# Patient Record
Sex: Female | Born: 1955 | Race: White | Hispanic: No | Marital: Married
Health system: Southern US, Community
[De-identification: ages and names within clinical notes are randomized; demographics above are authoritative.]

---

## 2000-05-22 ENCOUNTER — Emergency Department (HOSPITAL_COMMUNITY): Admission: EM | Admit: 2000-05-22 | Discharge: 2000-05-22 | Payer: Self-pay

## 2001-03-18 ENCOUNTER — Encounter: Payer: Self-pay | Admitting: Family Medicine

## 2001-03-18 ENCOUNTER — Encounter: Admission: RE | Admit: 2001-03-18 | Discharge: 2001-03-18 | Payer: Self-pay | Admitting: Family Medicine

## 2002-04-19 ENCOUNTER — Other Ambulatory Visit: Admission: RE | Admit: 2002-04-19 | Discharge: 2002-04-19 | Payer: Self-pay | Admitting: Family Medicine

## 2004-04-04 ENCOUNTER — Other Ambulatory Visit: Admission: RE | Admit: 2004-04-04 | Discharge: 2004-04-04 | Payer: Self-pay | Admitting: Family Medicine

## 2005-04-14 ENCOUNTER — Other Ambulatory Visit: Admission: RE | Admit: 2005-04-14 | Discharge: 2005-04-14 | Payer: Self-pay | Admitting: Physician Assistant

## 2006-04-02 ENCOUNTER — Ambulatory Visit (HOSPITAL_COMMUNITY): Admission: RE | Admit: 2006-04-02 | Discharge: 2006-04-02 | Payer: Self-pay | Admitting: Family Medicine

## 2006-06-25 ENCOUNTER — Other Ambulatory Visit: Admission: RE | Admit: 2006-06-25 | Discharge: 2006-06-25 | Payer: Self-pay | Admitting: Family Medicine

## 2007-02-16 ENCOUNTER — Inpatient Hospital Stay (HOSPITAL_COMMUNITY): Admission: EM | Admit: 2007-02-16 | Discharge: 2007-02-16 | Payer: Self-pay | Admitting: Emergency Medicine

## 2007-06-07 ENCOUNTER — Ambulatory Visit: Payer: Self-pay | Admitting: Internal Medicine

## 2007-07-14 ENCOUNTER — Other Ambulatory Visit: Admission: RE | Admit: 2007-07-14 | Discharge: 2007-07-14 | Payer: Self-pay | Admitting: Family Medicine

## 2007-09-06 ENCOUNTER — Ambulatory Visit (HOSPITAL_BASED_OUTPATIENT_CLINIC_OR_DEPARTMENT_OTHER): Admission: RE | Admit: 2007-09-06 | Discharge: 2007-09-06 | Payer: Self-pay | Admitting: Surgery

## 2007-09-06 ENCOUNTER — Encounter (INDEPENDENT_AMBULATORY_CARE_PROVIDER_SITE_OTHER): Payer: Self-pay | Admitting: Surgery

## 2008-07-17 ENCOUNTER — Other Ambulatory Visit: Admission: RE | Admit: 2008-07-17 | Discharge: 2008-07-17 | Payer: Self-pay | Admitting: Family Medicine

## 2008-07-26 ENCOUNTER — Ambulatory Visit (HOSPITAL_COMMUNITY): Admission: RE | Admit: 2008-07-26 | Discharge: 2008-07-26 | Payer: Self-pay | Admitting: *Deleted

## 2008-12-04 ENCOUNTER — Other Ambulatory Visit: Admission: RE | Admit: 2008-12-04 | Discharge: 2008-12-04 | Payer: Self-pay | Admitting: Family Medicine

## 2009-07-19 ENCOUNTER — Other Ambulatory Visit: Admission: RE | Admit: 2009-07-19 | Discharge: 2009-07-19 | Payer: Self-pay | Admitting: Family Medicine

## 2010-05-29 ENCOUNTER — Other Ambulatory Visit: Admission: RE | Admit: 2010-05-29 | Discharge: 2010-05-29 | Payer: Self-pay | Admitting: Family Medicine

## 2010-09-18 ENCOUNTER — Ambulatory Visit (HOSPITAL_COMMUNITY): Admission: RE | Admit: 2010-09-18 | Discharge: 2010-09-18 | Payer: Self-pay | Admitting: Family Medicine

## 2011-05-13 NOTE — Op Note (Signed)
NAME:  Teresa Rosario, Teresa Rosario                ACCOUNT NO.:  [id number]   MEDICAL RECORD NO.:  [id number]          PATIENT TYPE:  AMB   LOCATION:  DSC                          FACILITY:  [facility]   PHYSICIAN:  Ardeth Sportsman, MD     DATE OF BIRTH:  10/19/1956   DATE OF PROCEDURE:  DATE OF DISCHARGE:                               OPERATIVE REPORT   PREOPERATIVE DIAGNOSIS:  Prolapsing and bleeding stage IV hemorrhoids.   POSTOPERATIVE DIAGNOSES:  1. Prolapsing and bleeding stage IV hemorrhoids.  2. Mild anal stenosis.   PROCEDURE PERFORMED:  1. Examination under anesthesia.  2. External and internal hemorrhoidectomy times 3.   ANESTHESIA:  1. General anesthesia.  2. Anorectal region block with 0.25% bupivacaine with epinephrine      mixed in with [place].   SPECIMENS:  Hemorrhoidal tissue.   DRAINS:  None.   ESTIMATED BLOOD LOSS:  30 mL.   COMPLICATIONS:  No major complications.   INDICATIONS:  Teresa Rosario is a 55 year old female who has been  struggling with hemorrhoids for most of her life given she has irregular  bowel problems.  She is on a bowel regimen.  It seems to have calmed  down, however, she has difficulty pain and discomfort and hard to have  good hygiene in the region.  Teresa Rosario has maximized medical therapy without  any improvement in symptoms.   Anatomy and physiology of hemorrhoidal tissue in the anorectal track was  discussed in detail.  Pathophysiology of hemorrhoidal bleeding and pain  was explained.  Natural history was discussed.  Options were discussed  and recommendations made for an examination under anesthesia with  probably hemorrhoidectomy.  If she had significant muscular anal  hypertensive sphincter she might require sphincterotomy as well.  Risks  such as stroke, heart attack, deep venous thrombosis, pulmonary embolism  and death discussed.  Risk such as bleeding, need for transfusion,  bruising, wound infection, abscess, prolonged pain urinary and rectal  retention, anal stenosis, need for reoperation and other risks were  discussed.  Questions answered and she agreed to proceed.   OPERATIVE FINDINGS:  She had 3 pile chronically prolapsing internal  hemorrhoids, left lateral worse than right, posterior worse than right  anterior, but all significant with chronic epithelization of internal  hemorrhoids and turning into external hemorrhoids.  She did have some  anal stenosis that seemed to be more involved with the skin but the  sphincter itself was relaxed.  There was no rectal wall abnormalities.   DESCRIPTION OF PROCEDURE:  Informed consent was confirmed.  The patient  had received a rectal prep prior to surgery. She underwent general  anesthesia without difficulty.  She had the sequential compression  devices active during the entire case.  Her perineum was prepped and  draped in a sterile fashion.  Given her extensive disease she did give a  gram of cefoxitin.  She had held her aspirin over a week prior to  surgery.  She was positioned in high lithotomy.  Her perineum and  perianal region were prepped and draped in a sterile fashion.  Anorectal  block was placed.   Examination under anesthesia was performed and showed findings noted  above with 3 pile inflamed hemorrhoids with prolapse and internal and  external hemorrhoids.  She did have some tightness around her anus but  would allow 2 large fingers easily to pass and medium speculum rather  easily.  Attention was then turned toward the left lateral hemorrhoid  pile as that was the most significant.  A 2-0 Vicryl stitch was made at  the hemorrhoid as proximally as possible at its base.  The rectal mucosa  was incised longitudinally using a scalpel in a biconcave lenticular  incision, taking just about 5 mm wide of mucosa.  Scissors were used  using a blunt dissection to get around the large vascular pile, get down  to its base.  Excess skin tag was also carefully trimmed on the  internal  side but cut in a way to reserve excess external tissue.  Hemostasis was  insured by using a running Vicryl stitch from proximal to distal to near  the level of the anoderm.  A few interrupted stitches were used to help  bring the excess skin tag down to the anorectal dermal junction in a  transverse fashion to avoid any stenosis.   Excisions were made in the right posterior and right anterior  hemorrhoidal piles in a similar fashion.  Circumferential inspection was  done revealing excellent hemostasis.  Again the sphincter was inspected.  It was palpated and felt to be relaxed enough.  There was no worsening  anal stenosis and again allowed tolerated the speculum dilation rather  easily.  Large Gelfoam wrapped in water soluble lubricant was placed  into the rectum.  Pressure was held.  Hemostasis was good.  Sterile  dressing applied.  The patient was extubated and sent to the recovery  room in stable condition.   I explained the operative findings to the patient's husband. I did  discuss postoperative instructions to the patient and the husband prior  to surgery and I reinforced them again with her husband after surgery.  Will follow up with her in 2 weeks.  They expressed understanding and  appreciation.      Ardeth Sportsman, MD  Electronically Signed     SCG/MEDQ  D:  09/06/2007  T:  09/06/2007  Job:  [id number]   cc:   Emeterio Reeve, MD

## 2011-05-16 NOTE — Discharge Summary (Signed)
NAME:  Teresa Rosario, Teresa Rosario                ACCOUNT NO.:  [id number]   MEDICAL RECORD NO.:  [id number]          PATIENT TYPE:  INP   LOCATION:  1440                         FACILITY:  [facility]   PHYSICIAN:  Michelene Gardener, MD    DATE OF BIRTH:  [birth year]   DATE OF ADMISSION:  02/15/2007  DATE OF DISCHARGE:  02/16/2007                               DISCHARGE SUMMARY   The patient left against medical advice in the same day.   DIAGNOSES:  1. Chest pain.  2. Gastroesophageal reflux disease.  3. Hypertension.  4. Hyperlipidemia.  5. Hypothyroidism.   MEDICATIONS:  The patient not given medications because he left against  medical advice, but when he came in he was taking:  1. Simvastatin 20 mg p.o. once daily.  2. Atenolol 50 mg p.o. once daily.  3. Hydrochlorothiazide 25 mg p.o. once daily.  4. Synthroid 100 mcg p.o. once daily.   CONSULTATIONS:  Cardiology consult. The patient was to be seen by  cardiology, but he left before cardiology evaluation.   PROCEDURES:  None.   COURSE OF HOSPITALIZATION:  This is a 55 year old Caucasian female with  past medical history of hypertension, hyperlipidemia, and hypothyroidism  who presented with increasing chest pain described as feeling of  indigestion. She was also complaining of weakness. She reported that she  had increasing shortness of breath especially when she walks. The  patient was admitted to the telemetry floor for further evaluation and  to be ruled out. Three sets of troponin and cardiac enzymes were done,  and results were negative. Cardiology consultation was called for  further evaluation for possible stress test because of her risk factors  which include hypertension and hyperlipidemia. The patient left against  medical advice before seen by cardiology, and the risks were explained  to her. The patient insisted on leaving. Assessment time is 40 minutes.      Michelene Gardener, MD  Electronically Signed     NAE/MEDQ  D:   03/20/2007  T:  03/20/2007  Job:  [id number]

## 2011-05-16 NOTE — H&P (Signed)
NAME:  Teresa Rosario, Teresa Rosario                ACCOUNT NO.:  [id number]   MEDICAL RECORD NO.:  [id number]          PATIENT TYPE:  INP   LOCATION:  0101                         [facility]AC[facility]   PHYSICIAN:  Michelene Gardener, MD    DATE OF BIRTH:  [birth year]   DATE OF ADMISSION:  02/15/2007  DATE OF DISCHARGE:                              HISTORY & PHYSICAL   PRIMARY CARE PHYSICIAN:  Dr. Laurine Blazer at [facility].   CHIEF COMPLAINT:  Chest pain.   HISTORY OF PRESENT ILLNESS:  This is a 55 year old Caucasian female with  past medical history of hypertension, hyperlipidemia and hypothyroidism  presenting with the above-mentioned complaints.  She stated that for the  last 3 days she has been feeling indigestion symptoms.  She was also  complaining of loss of energy.  Her husband stated that she has been  having a lot of shortness of breath, especially when she walks.  The  patient stated that today she felt very weak with loss of energy and she  was having nausea and no desire to eat any food.  She was also  complaining of chest pressure around 5-6 out of 10, not radiating,  associated with nausea and shortness of breath that was started today  and that is why she decided to come to the emergency room.   In the emergency room, her vitals showed blood pressure is 181/96 and  this came back to 131/76, pulse 89, respiratory rate 16 and temperature  is 98.5.   MEDICATIONS:  1. Significant for Simvastatin 20 mg p.o. once daily.  2. Atenolol 50 mg p.o. once daily.  3. Hydrochlorothiazide 25 mg p.o. once daily.  4. Synthroid 100 mcg p.o. once daily.   PAST SURGICAL HISTORY:  Status post hysterectomy.   ALLERGIES:  The patient is allergic to MORPHINE.   SOCIAL HISTORY:  Quit smoking around 3 years ago.  She drinks wine once  in a while.  She denies recreational drugs.  She is married and has one  daughter.   FAMILY HISTORY:  Significant for her father had coronary artery disease  at  the age of 39 when he had MI and then one year after at age 86 he had  CABG.  One of her sister had history of coronary artery disease.   REVIEW OF SYSTEMS:  CONSTITUTIONAL:  Positive for nausea, fatigue and  loss of energy.  EYES:  No blurred vision.  ENT:  No tinnitus.  RESPIRATORY:  No cough, no wheezes.  CARDIOVASCULAR:  Positive for chest  pain and increasing shortness of breath.  GI:  Positive for nausea.  There is no vomiting and no abdominal pain.  GU:  No dysuria and no  hematuria.  ENDOCRINE:  No polyuria, no nocturia.  HEMATOLOGY:  No  bruising, no bleeding.  ID:  No rash, no lesion.  NEUROLOGICAL:  No  numbness, no tingling.  The rest of the systems reviewed and they were  negative.   PHYSICAL EXAMINATION:  VITAL SIGNS:  Temperature is 98, blood pressure  131/76, pulse 75, respiratory rate 20.  GENERAL APPEARANCE:  This is a middle-aged Caucasian female who is  laying down in bed in no acute distress.  HEENT:  Conjunctivae showed no erythema.  Pupils are equal, round, and  reactive to light and accommodation.  There is no ptosis.  Hearing is  intact.  There is no ear discharge or infection.  Nose has no discharge,  infection or bleeding.  Oral mucosa is dry.  No pharyngeal erythema.  Neck is supple.  No JVD, no carotid bruits, no lymphadenopathy, no  thyroid enlargement.  There is no thyroid tenderness.  CARDIOVASCULAR EXAMINATION:  S1-S2 regular.  There is no murmur, S3, S4  heard.  RESPIRATORY EXAMINATION:  The patient is breathing between 16-18.  There  is no use of accessory muscles.  No intercostal retractions.  No  dullness.  No rhonchi and no wheeze.  ABDOMINAL EXAMINATION:  The abdomen is soft, nondistended, nontender.  No hepatosplenomegaly.  The bowel sounds are normal.  LOWER EXTREMITIES:  No edema, no rash and no varicose.  SKIN:  No rash and no erythema.  NEUROLOGICAL:  Cranial nerves II through XII intact.  There is no motor  or sensory deficit.   LAB  RESULTS:  Sodium 133, potassium 2.7, chloride 94, carbon dioxide 26,  glucose 101, BUN 5, creatinine 0.82, calcium 8.9.  A troponin less than  0.05.  CK-MB is 2.6.  EKG is showing some ST depressions in  inferolateral leads.   IMPRESSION/ASSESSMENT:  1. Chest pain: This might be anginal equivalent.  This patient's risk      factors include her hypertension, her hyperlipidemia and previous      history of smoking.  Her EKG is showing some ST depressions and I      do not have old EKG for comparison.  We will admit her to      telemetry.  I will get three set of troponin and cardiac enzymes.      We will review her EKG in the morning.  We will continue her      atenolol and Zocor.  We will add aspirin, sublingual nitroglycerin      and Lovenox.  I will request cardiology evaluation in the morning      to evaluate for a stress test, inpatient versus outpatient.  2. Gastroesophageal reflux disease:  Her symptoms might be secondary      to gastroesophageal reflux disease.  We will start her on Protonix      and watch her.  3. Hypertension:  We will continue her current medications.  Follow      her blood pressure.  4. Hyperlipidemia:  Continue current medications.  5. Hypothyroidism:  We will continue Synthroid and we will get TSH      level.   Assessment time is 40 minutes.      Michelene Gardener, MD  Electronically Signed     NAE/MEDQ  D:  02/16/2007  T:  02/16/2007  Job:  [id number]

## 2011-05-28 ENCOUNTER — Other Ambulatory Visit: Payer: Self-pay | Admitting: Gastroenterology

## 2011-06-12 ENCOUNTER — Other Ambulatory Visit (HOSPITAL_COMMUNITY)
Admission: RE | Admit: 2011-06-12 | Discharge: 2011-06-12 | Disposition: A | Discharge status: Home / Self Care | Payer: Managed Care, Other (non HMO) | Source: Ambulatory Visit | Attending: Family Medicine | Admitting: Family Medicine

## 2011-06-12 ENCOUNTER — Other Ambulatory Visit: Payer: Self-pay | Admitting: Family Medicine

## 2011-06-12 DIAGNOSIS — Z124 Encounter for screening for malignant neoplasm of cervix: Secondary | ICD-10-CM | POA: Insufficient documentation

## 2011-06-12 DIAGNOSIS — Z1159 Encounter for screening for other viral diseases: Secondary | ICD-10-CM | POA: Insufficient documentation

## 2011-09-16 ENCOUNTER — Other Ambulatory Visit (HOSPITAL_COMMUNITY): Payer: Self-pay | Admitting: Family Medicine

## 2011-09-16 DIAGNOSIS — Z1231 Encounter for screening mammogram for malignant neoplasm of breast: Secondary | ICD-10-CM

## 2011-09-24 ENCOUNTER — Ambulatory Visit (HOSPITAL_COMMUNITY)
Admission: RE | Admit: 2011-09-24 | Discharge: 2011-09-24 | Disposition: A | Discharge status: Home / Self Care | Payer: Managed Care, Other (non HMO) | Source: Ambulatory Visit | Attending: Family Medicine | Admitting: Family Medicine

## 2011-09-24 DIAGNOSIS — Z1231 Encounter for screening mammogram for malignant neoplasm of breast: Secondary | ICD-10-CM | POA: Insufficient documentation

## 2011-10-10 LAB — POCT HEMOGLOBIN-HEMACUE
Hemoglobin: 11.3 — ABNORMAL LOW
Operator id: 123881

## 2011-10-10 LAB — BASIC METABOLIC PANEL
Chloride: 100
GFR calc non Af Amer: >60
Glucose, Bld: 117 — ABNORMAL HIGH
Potassium: 4
Sodium: 134 — ABNORMAL LOW

## 2012-07-05 ENCOUNTER — Other Ambulatory Visit (HOSPITAL_COMMUNITY)
Admission: RE | Admit: 2012-07-05 | Discharge: 2012-07-05 | Disposition: A | Discharge status: Home / Self Care | Payer: Managed Care, Other (non HMO) | Source: Ambulatory Visit | Attending: Family Medicine | Admitting: Family Medicine

## 2012-07-05 ENCOUNTER — Other Ambulatory Visit: Payer: Self-pay | Admitting: Family Medicine

## 2012-07-05 DIAGNOSIS — Z124 Encounter for screening for malignant neoplasm of cervix: Secondary | ICD-10-CM | POA: Insufficient documentation

## 2012-09-22 ENCOUNTER — Other Ambulatory Visit (HOSPITAL_COMMUNITY): Payer: Self-pay | Admitting: Family Medicine

## 2012-09-22 DIAGNOSIS — Z1231 Encounter for screening mammogram for malignant neoplasm of breast: Secondary | ICD-10-CM

## 2012-10-05 ENCOUNTER — Ambulatory Visit (HOSPITAL_COMMUNITY)
Admission: RE | Admit: 2012-10-05 | Discharge: 2012-10-05 | Disposition: A | Discharge status: Home / Self Care | Payer: Managed Care, Other (non HMO) | Source: Ambulatory Visit | Attending: Family Medicine | Admitting: Family Medicine

## 2012-10-05 DIAGNOSIS — Z1231 Encounter for screening mammogram for malignant neoplasm of breast: Secondary | ICD-10-CM | POA: Insufficient documentation

## 2013-09-27 ENCOUNTER — Other Ambulatory Visit (HOSPITAL_COMMUNITY): Payer: Self-pay | Admitting: Family Medicine

## 2013-09-27 DIAGNOSIS — Z1231 Encounter for screening mammogram for malignant neoplasm of breast: Secondary | ICD-10-CM

## 2013-10-10 ENCOUNTER — Ambulatory Visit (HOSPITAL_COMMUNITY)
Admission: RE | Admit: 2013-10-10 | Discharge: 2013-10-10 | Disposition: A | Discharge status: Home / Self Care | Payer: Managed Care, Other (non HMO) | Source: Ambulatory Visit | Attending: Family Medicine | Admitting: Family Medicine

## 2013-10-10 DIAGNOSIS — Z1231 Encounter for screening mammogram for malignant neoplasm of breast: Secondary | ICD-10-CM

## 2014-10-03 ENCOUNTER — Other Ambulatory Visit: Payer: Self-pay | Admitting: Gastroenterology

## 2014-10-05 ENCOUNTER — Other Ambulatory Visit (HOSPITAL_COMMUNITY): Payer: Self-pay | Admitting: Family Medicine

## 2014-10-05 DIAGNOSIS — Z1231 Encounter for screening mammogram for malignant neoplasm of breast: Secondary | ICD-10-CM

## 2014-10-12 ENCOUNTER — Ambulatory Visit (HOSPITAL_COMMUNITY)
Admission: RE | Admit: 2014-10-12 | Discharge: 2014-10-12 | Disposition: A | Discharge status: Home / Self Care | Payer: Managed Care, Other (non HMO) | Source: Ambulatory Visit | Attending: Family Medicine | Admitting: Family Medicine

## 2014-10-12 DIAGNOSIS — Z1231 Encounter for screening mammogram for malignant neoplasm of breast: Secondary | ICD-10-CM | POA: Insufficient documentation

## 2015-10-02 ENCOUNTER — Other Ambulatory Visit: Payer: Self-pay

## 2015-10-02 DIAGNOSIS — Z1231 Encounter for screening mammogram for malignant neoplasm of breast: Secondary | ICD-10-CM

## 2015-10-25 ENCOUNTER — Ambulatory Visit
Admission: RE | Admit: 2015-10-25 | Discharge: 2015-10-25 | Disposition: A | Discharge status: Home / Self Care | Payer: 59 | Source: Ambulatory Visit

## 2015-10-25 DIAGNOSIS — Z1231 Encounter for screening mammogram for malignant neoplasm of breast: Secondary | ICD-10-CM

## 2015-10-25 IMAGING — MG MM SCREENING BREAST TOMO BILATERAL
8 series · 8 of 24 positions shown · non-contrast
Comparison: Previous exam(s).

CLINICAL DATA: Screening.

EXAM:
DIGITAL SCREENING BILATERAL MAMMOGRAM WITH 3D TOMO WITH CAD

[R CC]
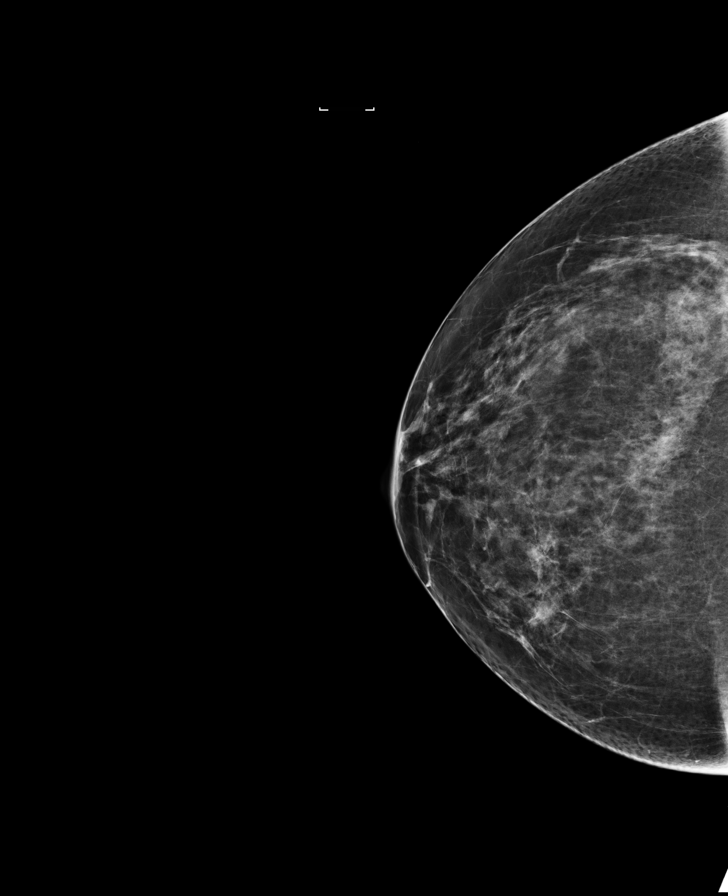

[R MLO]
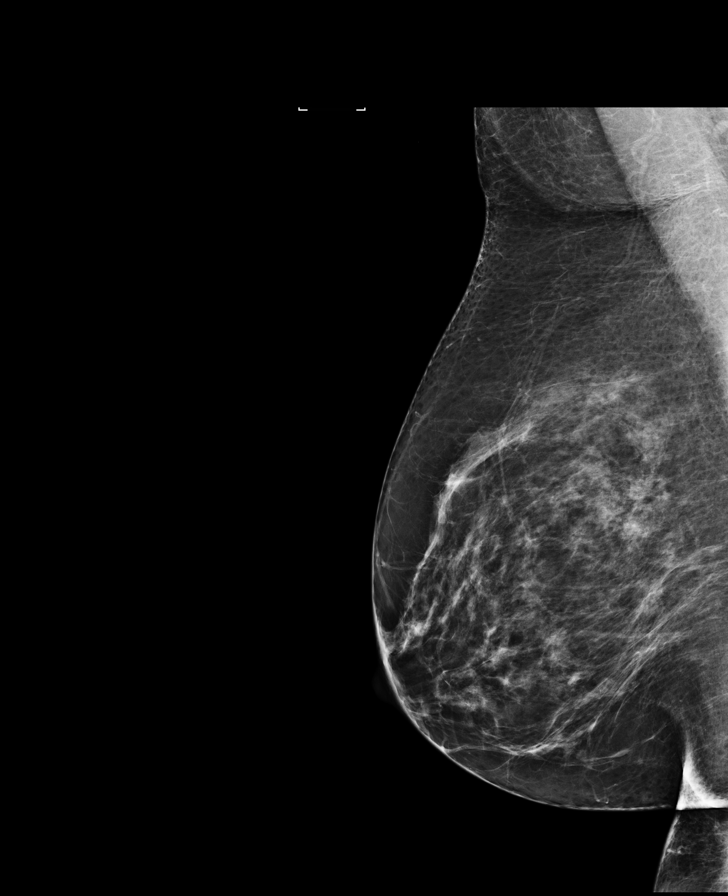

[L MLO]
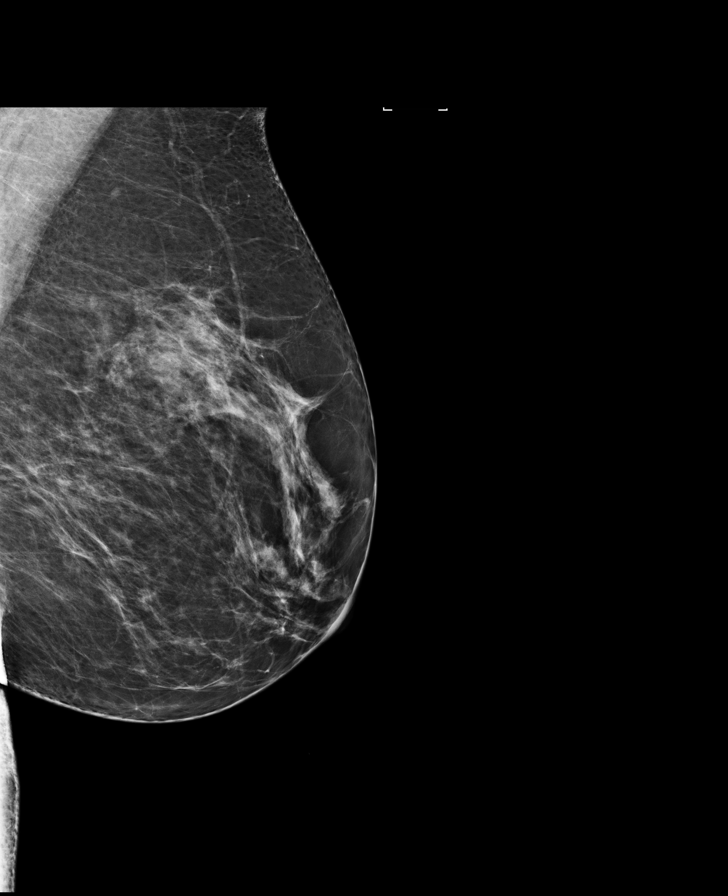

[L CC]
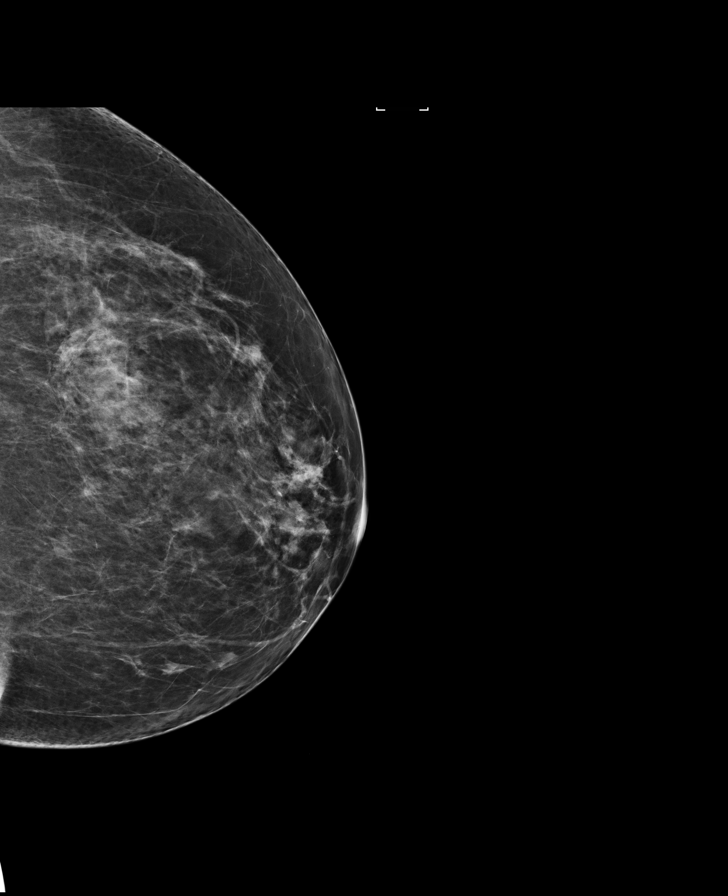

[L CC tomo · tomo slice 39/78.0]
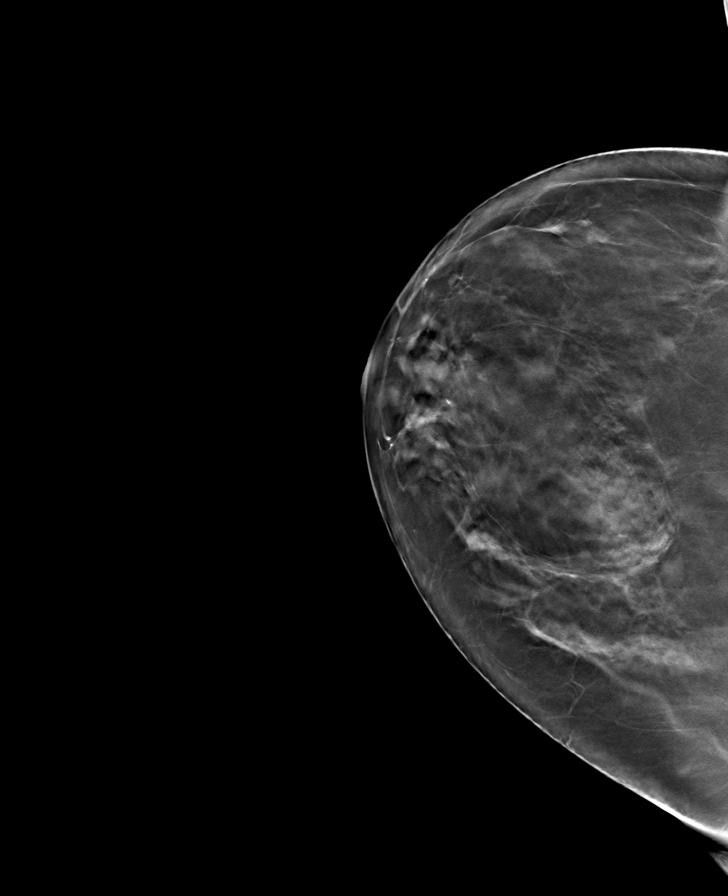

[R CC tomo · tomo slice 38/75.0]
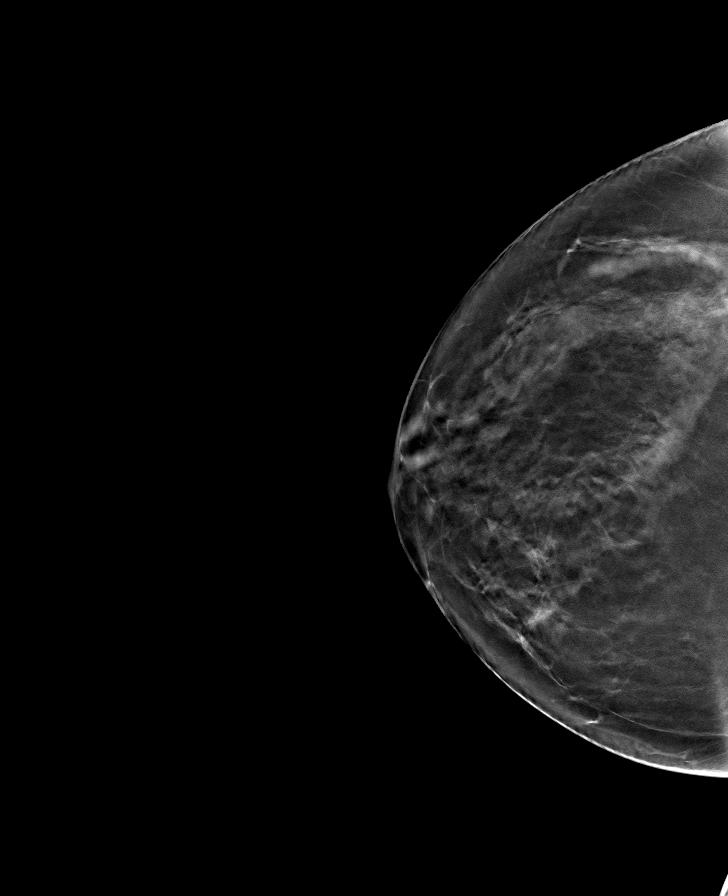

[R MLO tomo · tomo slice 43/84.0]
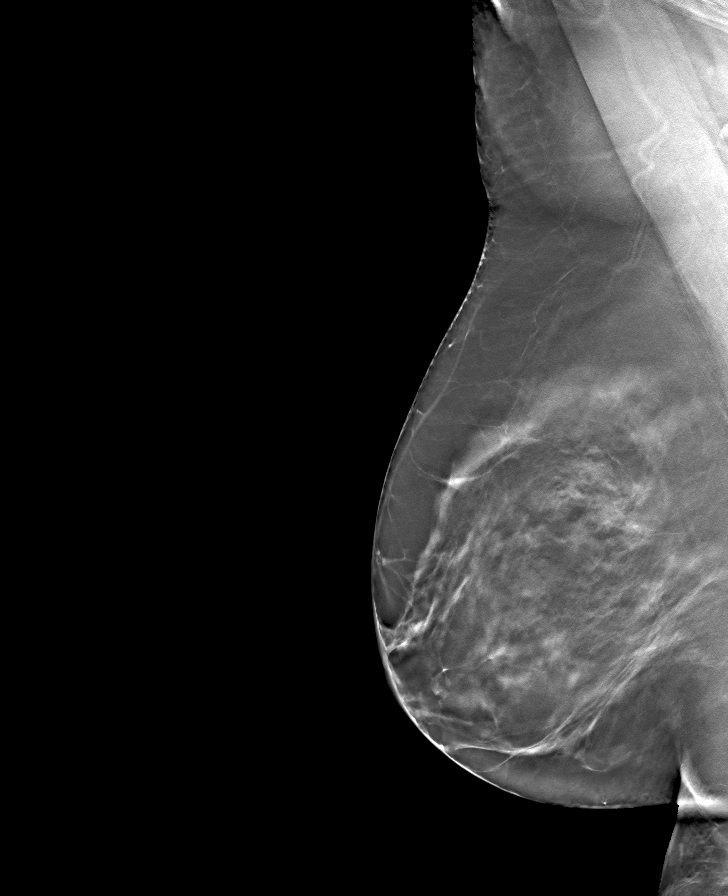

[L MLO tomo · tomo slice 42/83.0]
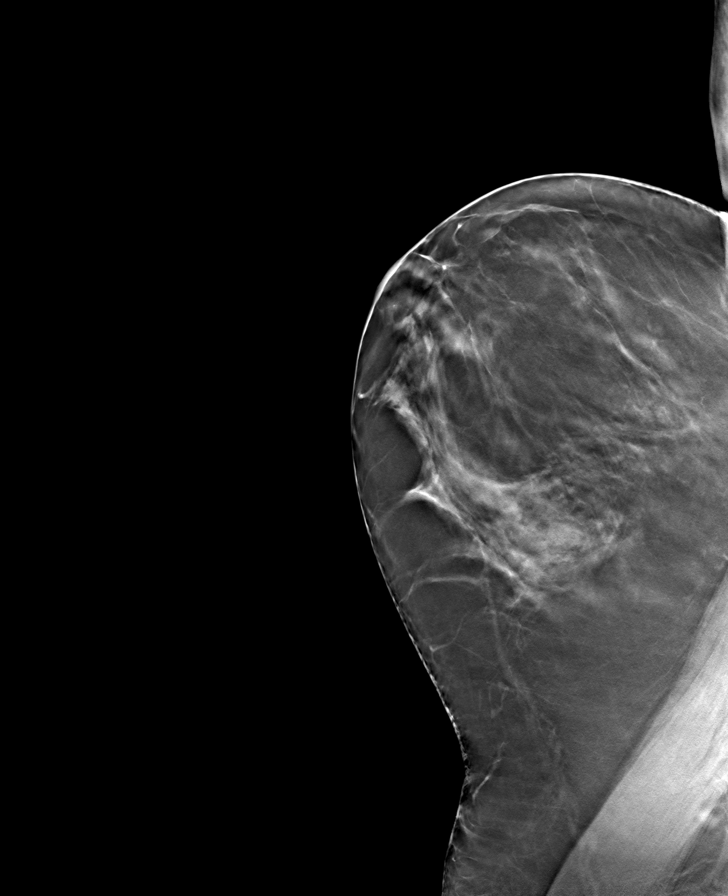

[8 of 24 positions shown; findings below may reference images not displayed]

ACR Breast Density Category b: There are scattered areas of
fibroglandular density.
FINDINGS: There are no findings suspicious for malignancy. Images were
processed with CAD.
IMPRESSION: No mammographic evidence of malignancy. A result letter of this
screening mammogram will be mailed directly to the patient.

RECOMMENDATION:
Screening mammogram in one year. (Code:[F0])

BI-RADS CATEGORY  1: Negative.

## 2016-04-21 ENCOUNTER — Telehealth: Payer: Self-pay | Admitting: *Deleted

## 2016-04-21 NOTE — Telephone Encounter (Signed)
Left message . On schedule 04/22/16, 8:30 am to have ambulatory blood pressure monitor applied.  The monitor was applied to a patient on Friday, 04/18/16, and was supposed to be returned to our office Monday.  Unfortunately the patient did not return the monitor and states will not be able to return it until after 2:30 PM, 04/22/16.  I apologize for the inconvenience.  Our office will try to call you 04/22/16, to reschedule.   [facility] number [facility] [phone] and Andee LinemanShelly Kenyatte Rosario [phone] given.

## 2016-04-22 ENCOUNTER — Encounter: Payer: Self-pay | Admitting: *Deleted

## 2016-04-22 ENCOUNTER — Ambulatory Visit (INDEPENDENT_AMBULATORY_CARE_PROVIDER_SITE_OTHER): Payer: 59

## 2016-04-22 DIAGNOSIS — I10 Essential (primary) hypertension: Secondary | ICD-10-CM

## 2016-04-22 NOTE — Progress Notes (Signed)
Patient ID: Teresa Rosario, female   DOB: [birth year], 58 y.o.   MRN: [id number] 24 hour ambulatory blood pressure monitor applied to patient.

## 2016-09-15 ENCOUNTER — Other Ambulatory Visit: Payer: Self-pay | Admitting: Family Medicine

## 2016-09-15 DIAGNOSIS — Z1231 Encounter for screening mammogram for malignant neoplasm of breast: Secondary | ICD-10-CM

## 2016-10-21 ENCOUNTER — Other Ambulatory Visit (HOSPITAL_COMMUNITY)
Admission: RE | Admit: 2016-10-21 | Discharge: 2016-10-21 | Disposition: A | Discharge status: Home / Self Care | Payer: 59 | Source: Ambulatory Visit | Attending: Family Medicine | Admitting: Family Medicine

## 2016-10-21 ENCOUNTER — Other Ambulatory Visit: Payer: Self-pay | Admitting: Family Medicine

## 2016-10-21 DIAGNOSIS — Z01411 Encounter for gynecological examination (general) (routine) with abnormal findings: Secondary | ICD-10-CM | POA: Diagnosis present

## 2016-10-21 DIAGNOSIS — Z1151 Encounter for screening for human papillomavirus (HPV): Secondary | ICD-10-CM | POA: Diagnosis present

## 2016-10-24 LAB — CYTOLOGY - PAP: HPV (WINDOPATH): NOT DETECTED

## 2016-10-27 ENCOUNTER — Ambulatory Visit
Admission: RE | Admit: 2016-10-27 | Discharge: 2016-10-27 | Disposition: A | Discharge status: Home / Self Care | Payer: 59 | Source: Ambulatory Visit | Attending: Family Medicine | Admitting: Family Medicine

## 2016-10-27 DIAGNOSIS — Z1231 Encounter for screening mammogram for malignant neoplasm of breast: Secondary | ICD-10-CM

## 2017-09-22 ENCOUNTER — Other Ambulatory Visit: Payer: Self-pay | Admitting: Family Medicine

## 2017-09-22 DIAGNOSIS — Z1231 Encounter for screening mammogram for malignant neoplasm of breast: Secondary | ICD-10-CM

## 2017-11-04 ENCOUNTER — Ambulatory Visit
Admission: RE | Admit: 2017-11-04 | Discharge: 2017-11-04 | Disposition: A | Discharge status: Home / Self Care | Payer: 59 | Source: Ambulatory Visit | Attending: Family Medicine | Admitting: Family Medicine

## 2017-11-04 DIAGNOSIS — Z1231 Encounter for screening mammogram for malignant neoplasm of breast: Secondary | ICD-10-CM

## 2017-11-23 ENCOUNTER — Other Ambulatory Visit: Payer: Self-pay | Admitting: Family Medicine

## 2017-11-23 ENCOUNTER — Other Ambulatory Visit (HOSPITAL_COMMUNITY)
Admission: RE | Admit: 2017-11-23 | Discharge: 2017-11-23 | Disposition: A | Discharge status: Home / Self Care | Payer: 59 | Source: Ambulatory Visit | Attending: Family Medicine | Admitting: Family Medicine

## 2017-11-23 DIAGNOSIS — Z01411 Encounter for gynecological examination (general) (routine) with abnormal findings: Secondary | ICD-10-CM | POA: Insufficient documentation

## 2017-11-24 LAB — CYTOLOGY - PAP: DIAGNOSIS: NEGATIVE

## 2018-10-06 ENCOUNTER — Other Ambulatory Visit: Payer: Self-pay | Admitting: Family Medicine

## 2018-10-06 DIAGNOSIS — Z1231 Encounter for screening mammogram for malignant neoplasm of breast: Secondary | ICD-10-CM

## 2018-11-10 ENCOUNTER — Ambulatory Visit
Admission: RE | Admit: 2018-11-10 | Discharge: 2018-11-10 | Disposition: A | Discharge status: Home / Self Care | Payer: 59 | Source: Ambulatory Visit | Attending: Family Medicine | Admitting: Family Medicine

## 2018-11-10 DIAGNOSIS — Z1231 Encounter for screening mammogram for malignant neoplasm of breast: Secondary | ICD-10-CM
